# Patient Record
Sex: Male | Born: 1965 | Race: Black or African American | Hispanic: No | Marital: Single | Smoking: Never smoker
Health system: Southern US, Community
[De-identification: ages and names within clinical notes are randomized; demographics above are authoritative.]

## PROBLEM LIST (undated history)

## (undated) DIAGNOSIS — I10 Essential (primary) hypertension: Secondary | ICD-10-CM

## (undated) DIAGNOSIS — M109 Gout, unspecified: Secondary | ICD-10-CM

## (undated) DIAGNOSIS — M1990 Unspecified osteoarthritis, unspecified site: Secondary | ICD-10-CM

## (undated) DIAGNOSIS — G4730 Sleep apnea, unspecified: Secondary | ICD-10-CM

## (undated) DIAGNOSIS — T7840XA Allergy, unspecified, initial encounter: Secondary | ICD-10-CM

## (undated) HISTORY — DX: Essential (primary) hypertension: I10

## (undated) HISTORY — DX: Sleep apnea, unspecified: G47.30

## (undated) HISTORY — DX: Unspecified osteoarthritis, unspecified site: M19.90

## (undated) HISTORY — DX: Gout, unspecified: M10.9

## (undated) HISTORY — DX: Allergy, unspecified, initial encounter: T78.40XA

## (undated) HISTORY — PX: CIRCUMCISION: SUR203

---

## 1998-02-23 ENCOUNTER — Encounter: Admission: RE | Admit: 1998-02-23 | Discharge: 1998-02-23 | Payer: Self-pay | Admitting: *Deleted

## 2005-10-25 ENCOUNTER — Emergency Department (HOSPITAL_COMMUNITY): Admission: EM | Admit: 2005-10-25 | Discharge: 2005-10-25 | Payer: Self-pay | Admitting: Emergency Medicine

## 2005-12-02 ENCOUNTER — Emergency Department (HOSPITAL_COMMUNITY): Admission: EM | Admit: 2005-12-02 | Discharge: 2005-12-02 | Payer: Self-pay | Admitting: Emergency Medicine

## 2008-02-24 ENCOUNTER — Emergency Department (HOSPITAL_COMMUNITY): Admission: EM | Admit: 2008-02-24 | Discharge: 2008-02-24 | Payer: Self-pay | Admitting: Emergency Medicine

## 2008-03-25 ENCOUNTER — Emergency Department (HOSPITAL_COMMUNITY): Admission: EM | Admit: 2008-03-25 | Discharge: 2008-03-25 | Payer: Self-pay | Admitting: Emergency Medicine

## 2008-11-23 ENCOUNTER — Emergency Department (HOSPITAL_COMMUNITY): Admission: EM | Admit: 2008-11-23 | Discharge: 2008-11-23 | Payer: Self-pay | Admitting: Family Medicine

## 2009-08-03 ENCOUNTER — Emergency Department (HOSPITAL_BASED_OUTPATIENT_CLINIC_OR_DEPARTMENT_OTHER): Admission: EM | Admit: 2009-08-03 | Discharge: 2009-08-03 | Payer: Self-pay | Admitting: Emergency Medicine

## 2010-05-09 ENCOUNTER — Emergency Department (HOSPITAL_COMMUNITY): Admission: EM | Admit: 2010-05-09 | Discharge: 2010-05-09 | Payer: Self-pay | Admitting: Family Medicine

## 2010-12-23 ENCOUNTER — Emergency Department (HOSPITAL_COMMUNITY)
Admission: EM | Admit: 2010-12-23 | Discharge: 2010-12-24 | Disposition: A | Discharge status: Home / Self Care | Payer: BC Managed Care – PPO | Attending: Emergency Medicine | Admitting: Emergency Medicine

## 2010-12-23 DIAGNOSIS — M79609 Pain in unspecified limb: Secondary | ICD-10-CM | POA: Insufficient documentation

## 2010-12-23 DIAGNOSIS — I10 Essential (primary) hypertension: Secondary | ICD-10-CM | POA: Insufficient documentation

## 2010-12-23 DIAGNOSIS — M109 Gout, unspecified: Secondary | ICD-10-CM | POA: Insufficient documentation

## 2011-04-26 ENCOUNTER — Inpatient Hospital Stay (INDEPENDENT_AMBULATORY_CARE_PROVIDER_SITE_OTHER)
Admission: RE | Admit: 2011-04-26 | Discharge: 2011-04-26 | Disposition: A | Discharge status: Home / Self Care | Payer: BC Managed Care – PPO | Source: Ambulatory Visit | Attending: Family Medicine | Admitting: Family Medicine

## 2011-04-26 DIAGNOSIS — N342 Other urethritis: Secondary | ICD-10-CM

## 2011-04-26 DIAGNOSIS — A64 Unspecified sexually transmitted disease: Secondary | ICD-10-CM

## 2011-04-26 DIAGNOSIS — R319 Hematuria, unspecified: Secondary | ICD-10-CM

## 2011-04-26 LAB — POCT URINALYSIS DIP (DEVICE)
Glucose, UA: NEGATIVE mg/dL
Nitrite: NEGATIVE
Protein, ur: 100 mg/dL — AB
Urobilinogen, UA: 0.2 mg/dL (ref 0.0–1.0)

## 2011-04-27 LAB — URINE CULTURE
Colony Count: NO GROWTH
Culture  Setup Time: 201208042115
Culture: NO GROWTH

## 2014-01-04 ENCOUNTER — Encounter: Payer: Self-pay | Admitting: Gastroenterology

## 2014-02-10 ENCOUNTER — Ambulatory Visit (AMBULATORY_SURGERY_CENTER): Payer: Self-pay | Admitting: *Deleted

## 2014-02-10 VITALS — Ht 69.5 in | Wt 265.0 lb

## 2014-02-10 DIAGNOSIS — Z1211 Encounter for screening for malignant neoplasm of colon: Secondary | ICD-10-CM

## 2014-02-10 MED ORDER — MOVIPREP 100 G PO SOLR: ORAL | Status: DC

## 2014-02-10 NOTE — Progress Notes (Signed)
No egg or soy allergy  No home oxygen use or problems with anesthesia  No medications for weight loss taken  emmi information given  Pt states he has been told he as sleep apnea, but hasn't started using a CPAP yet

## 2014-02-24 ENCOUNTER — Ambulatory Visit (AMBULATORY_SURGERY_CENTER): Payer: BC Managed Care – PPO | Admitting: Gastroenterology

## 2014-02-24 ENCOUNTER — Encounter: Payer: Self-pay | Admitting: Gastroenterology

## 2014-02-24 VITALS — BP 144/79 | HR 67 | Temp 96.8°F | Resp 18 | Ht 69.5 in | Wt 265.0 lb

## 2014-02-24 DIAGNOSIS — D128 Benign neoplasm of rectum: Secondary | ICD-10-CM

## 2014-02-24 DIAGNOSIS — D129 Benign neoplasm of anus and anal canal: Secondary | ICD-10-CM

## 2014-02-24 DIAGNOSIS — Z1211 Encounter for screening for malignant neoplasm of colon: Secondary | ICD-10-CM

## 2014-02-24 DIAGNOSIS — D126 Benign neoplasm of colon, unspecified: Secondary | ICD-10-CM

## 2014-02-24 MED ORDER — SODIUM CHLORIDE 0.9 % IV SOLN: 500.0000 mL | INTRAVENOUS | Status: DC

## 2014-02-24 NOTE — Progress Notes (Signed)
Called to room to assist during endoscopic procedure.  Patient ID and intended procedure confirmed with present staff. Received instructions for my participation in the procedure from the performing physician.  

## 2014-02-24 NOTE — Patient Instructions (Signed)
YOU HAD AN ENDOSCOPIC PROCEDURE TODAY AT THE [facility]: Refer to the procedure report that was given to you for any specific questions about what was found during the examination.  If the procedure report does not answer your questions, please call your gastroenterologist to clarify.  If you requested that your care partner not be given the details of your procedure findings, then the procedure report has been included in a sealed envelope for you to review at your convenience later.  YOU SHOULD EXPECT: Some feelings of bloating in the abdomen. Passage of more gas than usual.  Walking can help get rid of the air that was put into your GI tract during the procedure and reduce the bloating. If you had a lower endoscopy (such as a colonoscopy or flexible sigmoidoscopy) you may notice spotting of blood in your stool or on the toilet paper. If you underwent a bowel prep for your procedure, then you may not have a normal bowel movement for a few days.  DIET: Your first meal following the procedure should be a light meal and then it is ok to progress to your normal diet.  A half-sandwich or bowl of soup is an example of a good first meal.  Heavy or fried foods are harder to digest and may make you feel nauseous or bloated.  Likewise meals heavy in dairy and vegetables can cause extra gas to form and this can also increase the bloating.  Drink plenty of fluids but you should avoid alcoholic beverages for 24 hours.  ACTIVITY: Your care partner should take you home directly after the procedure.  You should plan to take it easy, moving slowly for the rest of the day.  You can resume normal activity the day after the procedure however you should NOT DRIVE or use heavy machinery for 24 hours (because of the sedation medicines used during the test).    SYMPTOMS TO REPORT IMMEDIATELY: A gastroenterologist can be reached at any hour.  During normal business hours, 8:30 AM to 5:00 PM Monday through Friday,  call [phone].  After hours and on weekends, please call the GI answering service at [phone] who will take a message and have the physician on call contact you.   Following lower endoscopy (colonoscopy or flexible sigmoidoscopy):  Excessive amounts of blood in the stool  Significant tenderness or worsening of abdominal pains  Swelling of the abdomen that is new, acute  Fever of 100F or higher   FOLLOW UP: If any biopsies were taken you will be contacted by phone or by letter within the next 1-3 weeks.  Call your gastroenterologist if you have not heard about the biopsies in 3 weeks.  Our staff will call the home number listed on your records the next business day following your procedure to check on you and address any questions or concerns that you may have at that time regarding the information given to you following your procedure. This is a courtesy call and so if there is no answer at the home number and we have not heard from you through the emergency physician on call, we will assume that you have returned to your regular daily activities without incident.  SIGNATURES/CONFIDENTIALITY: You and/or your care partner have signed paperwork which will be entered into your electronic medical record.  These signatures attest to the fact that that the information above on your After Visit Summary has been reviewed and is understood.  Full responsibility of the confidentiality of   this discharge information lies with you and/or your care-partner.  Polyp-handout given  Repeat colonoscopy will be determined by pathology   

## 2014-02-24 NOTE — Progress Notes (Signed)
A/ox3, pleased with MAC, report to RN 

## 2014-02-24 NOTE — Op Note (Signed)
[facility]  [address] [place], [address]   COLONOSCOPY PROCEDURE REPORT  PATIENT: Leonard King, Leonard King  MR#: [id number] BIRTHDATE: Nov 29, 1965 , 48  yrs. old GENDER: Male ENDOSCOPIST: Milus Banister, MD REFERRED YY:[id number] Minna Antis, M.D. PROCEDURE DATE:  02/24/2014 PROCEDURE:   Colonoscopy with snare polypectomy First Screening Colonoscopy - Avg.  risk and is 50 yrs.  old or older Yes.  Prior Negative Screening - Now for repeat screening. N/A  History of Adenoma - Now for follow-up colonoscopy & has been > or = to 3 yrs.  N/A  Polyps Removed Today? Yes. ASA CLASS:   Class II INDICATIONS:average risk screening. MEDICATIONS: MAC sedation, administered by CRNA and Propofol (Diprivan) 290 mg IV  DESCRIPTION OF PROCEDURE:   After the risks benefits and alternatives of the procedure were thoroughly explained, informed consent was obtained.  A digital rectal exam revealed no abnormalities of the rectum.   The [id number]  endoscope was introduced through the anus and advanced to the cecum, which was identified by both the appendix and ileocecal valve. No adverse events experienced.   The quality of the prep was excellent.  The instrument was then slowly withdrawn as the colon was fully examined.   COLON FINDINGS: One polyp was found, removed and sent to pathology. This was sessile, 90mm across, located in trasnverse segment, removed with cold snare.  The examination was otherwise normal. Retroflexed views revealed no abnormalities. The time to cecum=1 minutes 56 seconds.  Withdrawal time=8 minutes 17 seconds.  The scope was withdrawn and the procedure completed. COMPLICATIONS: There were no complications.  ENDOSCOPIC IMPRESSION: One polyp was found, removed and sent to pathology. The examination was otherwise normal.  RECOMMENDATIONS: If the polyp(s) removed today are proven to be adenomatous (pre-cancerous) polyps, you will need a repeat colonoscopy in  5 years.  Otherwise you should continue to follow colorectal cancer screening guidelines for "routine risk" patients with colonoscopy in 10 years.  You will receive a letter within 1-2 weeks with the results of your biopsy as well as final recommendations.  Please call my office if you have not received a letter after 3 weeks.   eSigned:  Milus Banister, MD 02/24/2014 8:44 AM

## 2014-02-27 ENCOUNTER — Telehealth: Payer: Self-pay

## 2014-02-27 NOTE — Telephone Encounter (Signed)
Left a message at [phone] for the pt to call back if any questions or concerns. Maw

## 2014-03-03 ENCOUNTER — Encounter: Payer: Self-pay | Admitting: Gastroenterology

## 2015-10-16 ENCOUNTER — Ambulatory Visit (INDEPENDENT_AMBULATORY_CARE_PROVIDER_SITE_OTHER): Payer: BLUE CROSS/BLUE SHIELD | Admitting: Family Medicine

## 2015-10-16 VITALS — BP 160/114 | HR 63 | Temp 98.5°F | Resp 20 | Ht 69.5 in | Wt 244.0 lb

## 2015-10-16 DIAGNOSIS — I10 Essential (primary) hypertension: Secondary | ICD-10-CM | POA: Diagnosis not present

## 2015-10-16 DIAGNOSIS — N509 Disorder of male genital organs, unspecified: Secondary | ICD-10-CM | POA: Diagnosis not present

## 2015-10-16 DIAGNOSIS — Z711 Person with feared health complaint in whom no diagnosis is made: Secondary | ICD-10-CM

## 2015-10-16 LAB — POCT URINALYSIS DIP (MANUAL ENTRY)
Bilirubin, UA: NEGATIVE
GLUCOSE UA: NEGATIVE
Ketones, POC UA: NEGATIVE
LEUKOCYTES UA: NEGATIVE
NITRITE UA: NEGATIVE
Protein Ur, POC: NEGATIVE
RBC UA: NEGATIVE
Spec Grav, UA: 1.01
UROBILINOGEN UA: 0.2
pH, UA: 6

## 2015-10-16 LAB — POC MICROSCOPIC URINALYSIS (UMFC): Mucus: ABSENT

## 2015-10-16 LAB — HIV ANTIBODY (ROUTINE TESTING W REFLEX): HIV: NONREACTIVE

## 2015-10-16 NOTE — Progress Notes (Signed)
Patient ID: Leonard King, male    DOB: Feb 13, 1966  Age: 50 y.o. MRN: [id number]  Chief Complaint  Patient presents with  . STD screening    don't know if was exposed, but feel funny at his private parts    Subjective:   50 year old man who has had the same sexual partner for the last 4-5 months. They have frequent sex. He has been having pain or abnormal sensation in the base of the shaft of penis, feeling like he is going to have something come out but he has not had drainage or discharge. He had gonorrhea many years ago and knows what that felt like. This just is bothering him and concerning him. 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 He has not had any trauma to his genitalia. He is otherwise healthy man.  He has a history of high blood pressure and is on losartan which he did not yet take today.  Current allergies, medications, problem list, past/family and social histories reviewed.  Objective:  BP 160/114 mmHg  Pulse 63  Temp(Src) 98.5 F (36.9 C) (Oral)  Resp 20  Ht 5' 9.5" (1.765 m)  Wt 244 lb (110.678 kg)  BMI 35.53 kg/m2  SpO2 97%  No major acute distress. Normal external genitalia. No lesions noted. No discharge noted. No hernias noted. Normal testes.  Assessment & Plan:   Assessment: 1. Concern about STD in male without diagnosis   2. Genital disorder, male   3. Essential hypertension       Plan: Genital discomfort, risk of STDs, will check Blood pressure is not good. He must take his medications and check it several times in the next few days and follow-up if it continues to stay high.  Orders Placed This Encounter  Procedures  . GC/Chlamydia Probe Amp  . HIV antibody  . RPR  . POCT urinalysis dipstick  . POCT Microscopic Urinalysis ([facility])    No orders of the defined types were placed in this encounter.         Patient Instructions  We will let you know the results of your STD testing in a few days  If the STD tests come back  normal, just give your symptoms a few weeks or month or so. If he keeps having problems get rechecked.  Take his blood pressure medicines faithfully. If you're pressure does not look much better in the next few days you need to speak to your primary care doctor about this.  Always use protection     No Follow-up on file.   HOPPER,DAVID, MD 10/16/2015

## 2015-10-16 NOTE — Patient Instructions (Addendum)
We will let you know the results of your STD testing in a few days  If the STD tests come back normal, just give your symptoms a few weeks or month or so. If he keeps having problems get rechecked.  Take his blood pressure medicines faithfully. If you're pressure does not look much better in the next few days you need to speak to your primary care doctor about this.  Always use protection

## 2015-10-17 LAB — GC/CHLAMYDIA PROBE AMP
CT PROBE, AMP APTIMA: NOT DETECTED
GC PROBE AMP APTIMA: NOT DETECTED

## 2015-10-17 LAB — RPR

## 2016-12-11 ENCOUNTER — Encounter: Payer: Self-pay | Admitting: Plastic Surgery

## 2017-05-19 ENCOUNTER — Other Ambulatory Visit: Payer: Self-pay | Admitting: Internal Medicine

## 2017-05-20 ENCOUNTER — Other Ambulatory Visit: Payer: Self-pay | Admitting: Internal Medicine

## 2017-05-20 DIAGNOSIS — R200 Anesthesia of skin: Secondary | ICD-10-CM

## 2017-06-03 ENCOUNTER — Inpatient Hospital Stay
Admission: RE | Admit: 2017-06-03 | Discharge: 2017-06-03 | Disposition: A | Discharge status: Home / Self Care | Payer: Self-pay | Source: Ambulatory Visit | Attending: Internal Medicine | Admitting: Internal Medicine

## 2017-11-03 DIAGNOSIS — I10 Essential (primary) hypertension: Secondary | ICD-10-CM | POA: Diagnosis not present

## 2017-11-03 DIAGNOSIS — M109 Gout, unspecified: Secondary | ICD-10-CM | POA: Diagnosis not present

## 2017-11-03 DIAGNOSIS — E781 Pure hyperglyceridemia: Secondary | ICD-10-CM | POA: Diagnosis not present

## 2017-11-09 DIAGNOSIS — I10 Essential (primary) hypertension: Secondary | ICD-10-CM | POA: Diagnosis not present

## 2017-11-09 DIAGNOSIS — M109 Gout, unspecified: Secondary | ICD-10-CM | POA: Diagnosis not present

## 2017-11-09 DIAGNOSIS — D179 Benign lipomatous neoplasm, unspecified: Secondary | ICD-10-CM | POA: Diagnosis not present

## 2017-11-11 DIAGNOSIS — K7689 Other specified diseases of liver: Secondary | ICD-10-CM | POA: Diagnosis not present

## 2017-11-11 DIAGNOSIS — M109 Gout, unspecified: Secondary | ICD-10-CM | POA: Diagnosis not present

## 2017-11-11 DIAGNOSIS — E559 Vitamin D deficiency, unspecified: Secondary | ICD-10-CM | POA: Diagnosis not present

## 2017-11-12 DIAGNOSIS — R740 Nonspecific elevation of levels of transaminase and lactic acid dehydrogenase [LDH]: Secondary | ICD-10-CM | POA: Diagnosis not present

## 2017-11-12 DIAGNOSIS — R945 Abnormal results of liver function studies: Secondary | ICD-10-CM | POA: Diagnosis not present

## 2018-02-24 DIAGNOSIS — K7689 Other specified diseases of liver: Secondary | ICD-10-CM | POA: Diagnosis not present

## 2018-02-24 DIAGNOSIS — M109 Gout, unspecified: Secondary | ICD-10-CM | POA: Diagnosis not present

## 2018-02-24 DIAGNOSIS — E7800 Pure hypercholesterolemia, unspecified: Secondary | ICD-10-CM | POA: Diagnosis not present

## 2018-03-08 DIAGNOSIS — I10 Essential (primary) hypertension: Secondary | ICD-10-CM | POA: Diagnosis not present

## 2018-03-08 DIAGNOSIS — D179 Benign lipomatous neoplasm, unspecified: Secondary | ICD-10-CM | POA: Diagnosis not present

## 2018-03-08 DIAGNOSIS — M5432 Sciatica, left side: Secondary | ICD-10-CM | POA: Diagnosis not present

## 2018-03-08 DIAGNOSIS — M109 Gout, unspecified: Secondary | ICD-10-CM | POA: Diagnosis not present

## 2018-05-14 DIAGNOSIS — M109 Gout, unspecified: Secondary | ICD-10-CM | POA: Diagnosis not present

## 2018-05-14 DIAGNOSIS — N529 Male erectile dysfunction, unspecified: Secondary | ICD-10-CM | POA: Diagnosis not present

## 2018-05-14 DIAGNOSIS — Z125 Encounter for screening for malignant neoplasm of prostate: Secondary | ICD-10-CM | POA: Diagnosis not present

## 2018-05-14 DIAGNOSIS — R5383 Other fatigue: Secondary | ICD-10-CM | POA: Diagnosis not present

## 2018-06-23 ENCOUNTER — Encounter (INDEPENDENT_AMBULATORY_CARE_PROVIDER_SITE_OTHER): Payer: Self-pay | Admitting: Orthopaedic Surgery

## 2018-06-23 ENCOUNTER — Ambulatory Visit (INDEPENDENT_AMBULATORY_CARE_PROVIDER_SITE_OTHER): Payer: Self-pay

## 2018-06-23 ENCOUNTER — Ambulatory Visit (INDEPENDENT_AMBULATORY_CARE_PROVIDER_SITE_OTHER): Payer: Commercial Managed Care - PPO | Admitting: Orthopaedic Surgery

## 2018-06-23 VITALS — BP 154/101 | HR 62 | Ht 70.0 in | Wt 240.0 lb

## 2018-06-23 DIAGNOSIS — M79605 Pain in left leg: Secondary | ICD-10-CM

## 2018-06-23 NOTE — Progress Notes (Signed)
Office Visit Note   Patient: Leonard King           Date of Birth: [birth year]           MRN: [id number] Visit Date: 06/23/2018              Requested by: Jani Gravel, [address] [place], [place] [address] PCP: Patient, No Pcp Per   Assessment & Plan: Visit Diagnoses:  1. Pain in left leg     Plan: We discussed the patient some of his symptoms may be related to gout.  He likely has some disc bulge with lateral recess narrowing and some numbness in his leg without weakness.  He needs to revisit with his PCP to see if he needs to be on allopurinol again particularly if he has some hyperuricemia which is aggravating his hips as well as lumbar facet joints possibly.  He can take his anti-inflammatories for several days before he restarts his allopurinol.  If he develops increased weakness he can return.  At present I would not recommend proceeding with an MRI scan lumbar since his symptoms are not significant enough to proceed with further treatment.  Follow-Up Instructions: No follow-ups on file.   Orders:  Orders Placed This Encounter  Procedures  . XR Pelvis 1-2 Views  . XR Lumbar Spine 2-3 Views   No orders of the defined types were placed in this encounter.     Procedures: No procedures performed   Clinical Data: No additional findings.   Subjective: Chief Complaint  Patient presents with  . Left Leg - Pain, Numbness    HPI 52 year old male with complaints of some numbness and tingling in the left lateral thigh this been going on for more than a year.  He denies associated back pain does not have any weakness.  He does a lot of walking he is noticed some increased numbness in the anterior thigh sometimes it bothers him at night if he is been active.  He is a former smoker.  Does have a history of gout but has stopped his allopurinol.  He does have indomethacin available and also colchicine which he uses occasionally.  He is not sure what his last  uric acid level was.  Review of Systems 14 point review of systems positive for left leg numbness.  Hypertension, history of gout otherwise negative is a pertains HPI.   Objective: Vital Signs: BP (!) 154/101   Pulse 62   Ht 5\' 10"  (1.778 m)   Wt 240 lb (108.9 kg)   BMI 34.44 kg/m   Physical Exam  Constitutional: He is oriented to person, place, and time. He appears well-developed and well-nourished.  HENT:  Head: Normocephalic and atraumatic.  Eyes: Pupils are equal, round, and reactive to light. EOM are normal.  Neck: No tracheal deviation present. No thyromegaly present.  Cardiovascular: Normal rate.  Pulmonary/Chest: Effort normal. He has no wheezes.  Abdominal: Soft. Bowel sounds are normal.  Neurological: He is alert and oriented to person, place, and time.  Skin: Skin is warm and dry. Capillary refill takes less than 2 seconds.  Psychiatric: He has a normal mood and affect. His behavior is normal. Judgment and thought content normal.    Ortho Exam negative straight leg raising negative for straight leg raising.  Normal hip flexion no adduction weakness.  Knee and ankle jerk 1+ and symmetrical he is able heel and toe walk.  He has difficulty doing figure 4 leg crossed  in the sitting position both right and left with more limitation of internal and external rotation of his hip on the left and right.  Specialty Comments:  No specialty comments available.  Imaging: No results found.   PMFS History: There are no active problems to display for this patient.  Past Medical History:  Diagnosis Date  . Allergy   . Arthritis   . Gout   . Hypertension   . Sleep apnea    not using CPAP yet    Family History  Problem Relation Age of Onset  . Hypertension Mother   . Hypertension Father   . Colon cancer Neg Hx   . Esophageal cancer Neg Hx   . Rectal cancer Neg Hx   . Stomach cancer Neg Hx     Past Surgical History:  Procedure Laterality Date  . CIRCUMCISION     in  high school, 2nd one   Social History   Occupational History  . Not on file  Tobacco Use  . Smoking status: Never Smoker  . Smokeless tobacco: Never Used  . Tobacco comment: marijuana- over 20 years ago  Substance and Sexual Activity  . Alcohol use: Yes    Comment: occasional  . Drug use: No  . Sexual activity: Not on file

## 2018-08-06 DIAGNOSIS — R972 Elevated prostate specific antigen [PSA]: Secondary | ICD-10-CM | POA: Diagnosis not present

## 2018-08-06 DIAGNOSIS — E291 Testicular hypofunction: Secondary | ICD-10-CM | POA: Diagnosis not present

## 2019-02-26 ENCOUNTER — Other Ambulatory Visit: Payer: Self-pay | Admitting: *Deleted

## 2019-02-26 DIAGNOSIS — Z20822 Contact with and (suspected) exposure to covid-19: Secondary | ICD-10-CM

## 2019-02-28 LAB — NOVEL CORONAVIRUS, NAA: SARS-CoV-2, NAA: NOT DETECTED

## 2019-03-02 ENCOUNTER — Telehealth: Payer: Self-pay

## 2019-03-02 NOTE — Telephone Encounter (Signed)
Patient returned call for covid test results, results given as noted not detected, which means you were not infected with the novel coronavirus. He verbalized understanding and says he feels great, no symptoms. He asked for a copy to be mailed to his home so that he can have it for his employer, correct address verified in chart. I advised it will be mailed out.

## 2019-03-02 NOTE — Telephone Encounter (Signed)
Patient requesting a copy be faxed to number below.   [phone] attn Candice

## 2019-03-02 NOTE — Telephone Encounter (Addendum)
Fax # [phone] did not work with 2 attempts to send COVID result to employer.  Called pt., and left vm to provide a different fax #.    Per pt. request, mailed copy of COVID 19 result to his residence, and faxed copy to Employer, to Attention of Candice @ [phone].

## 2019-11-18 ENCOUNTER — Other Ambulatory Visit: Payer: Self-pay | Admitting: Family Medicine

## 2019-11-18 DIAGNOSIS — M5416 Radiculopathy, lumbar region: Secondary | ICD-10-CM

## 2019-11-18 DIAGNOSIS — G5712 Meralgia paresthetica, left lower limb: Secondary | ICD-10-CM

## 2019-12-21 ENCOUNTER — Other Ambulatory Visit: Payer: Self-pay

## 2020-02-13 ENCOUNTER — Ambulatory Visit
Admission: RE | Admit: 2020-02-13 | Discharge: 2020-02-13 | Disposition: A | Discharge status: Home / Self Care | Payer: BC Managed Care – PPO | Source: Ambulatory Visit | Attending: Family Medicine | Admitting: Family Medicine

## 2020-02-13 ENCOUNTER — Other Ambulatory Visit: Payer: Self-pay

## 2020-02-13 DIAGNOSIS — M5416 Radiculopathy, lumbar region: Secondary | ICD-10-CM

## 2020-02-13 DIAGNOSIS — G5712 Meralgia paresthetica, left lower limb: Secondary | ICD-10-CM

## 2020-02-13 IMAGING — MR MR LUMBAR SPINE W/O CM
4 of 5 series · 25 of 48 positions shown · non-contrast
Comparison: Radiographs [DATE].

CLINICAL DATA: Left leg numbness from the mid thigh down for 3-4
years. No bowel or bladder changes, acute injury or prior relevant
surgery.

EXAM:
MRI LUMBAR SPINE WITHOUT CONTRAST
TECHNIQUE: Multiplanar, multisequence MR imaging of the lumbar spine was
performed. No intravenous contrast was administered.

[Series 3: T2 · sagittal · 4.0mm · 0.55mm/px · 6 of 17 slices shown (1 of 2)]
[im 1/17]
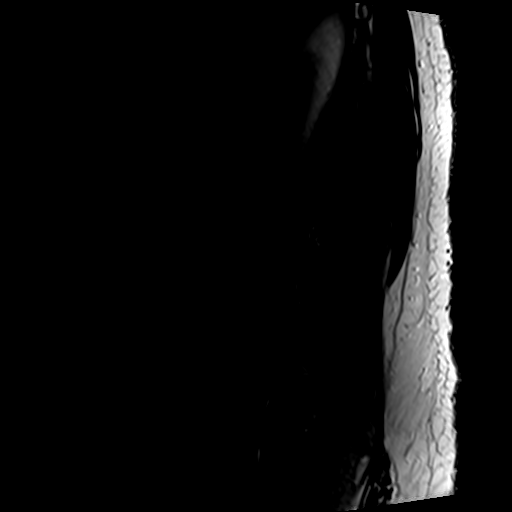
[im 4/17]
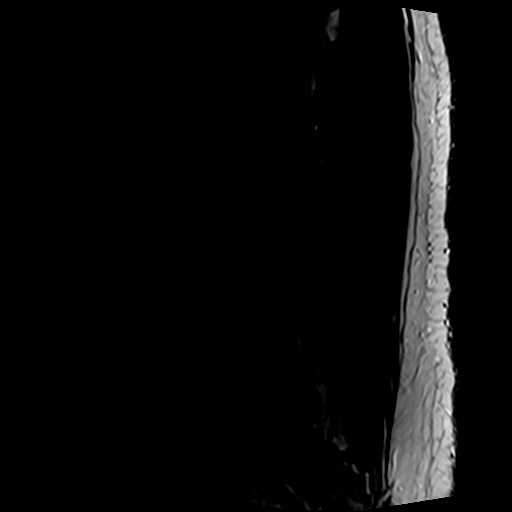
[im 7/17]
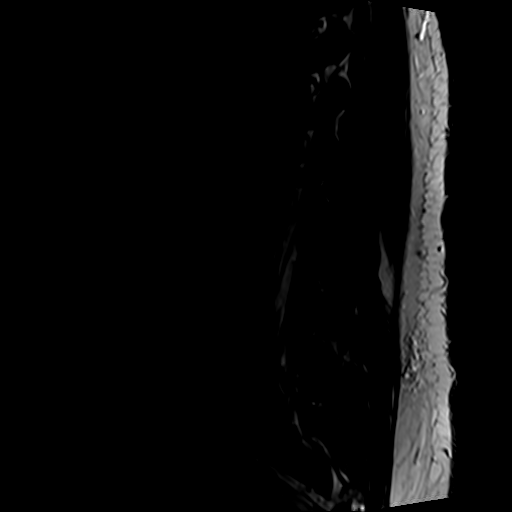
[im 10/17]
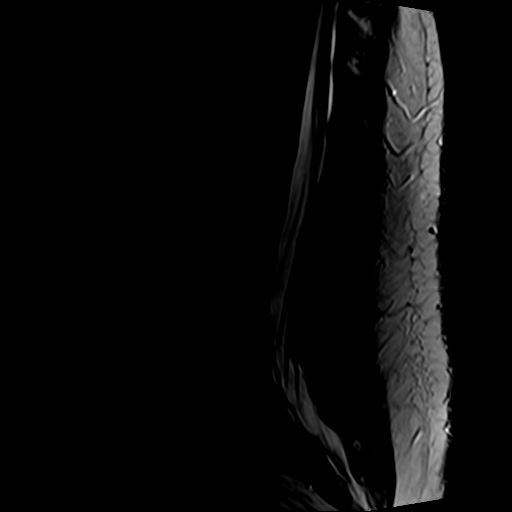
[im 13/17]
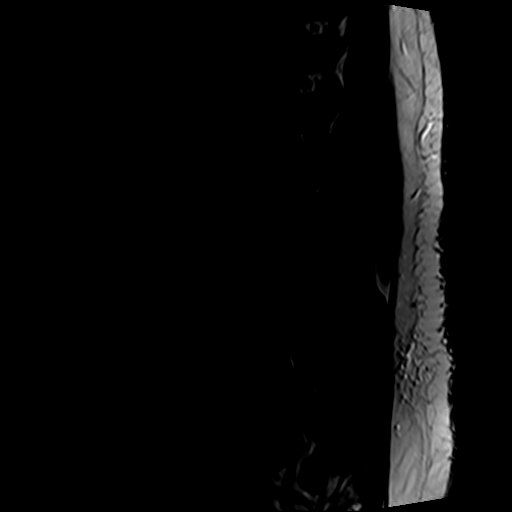
[im 17/17]
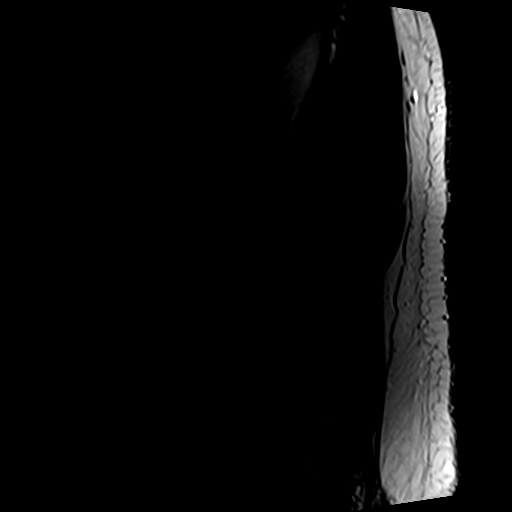

[Series 4: T1 · sagittal · 4.0mm · 0.55mm/px · 6 of 17 slices shown (1 of 2)]
[im 1/17]
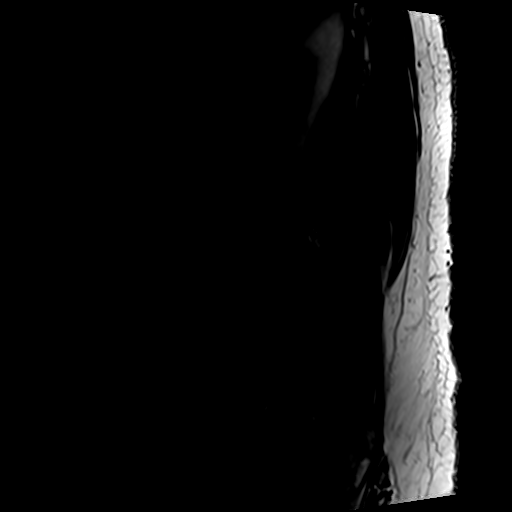
[im 4/17]
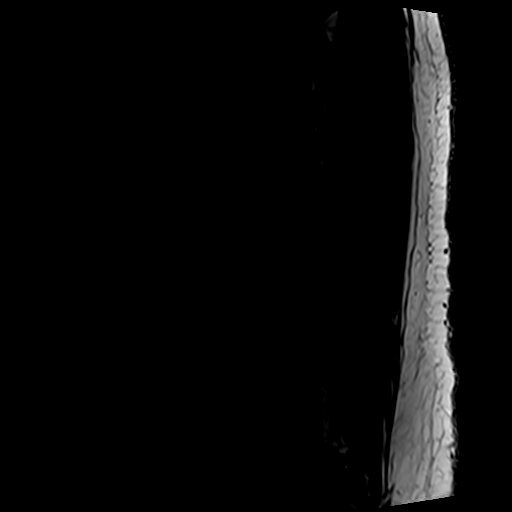
[im 7/17]
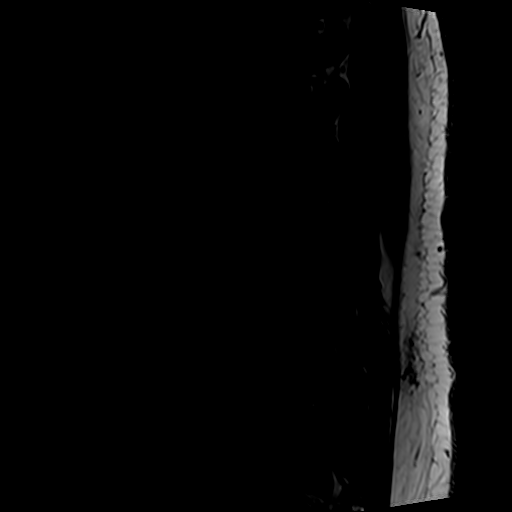
[im 10/17]
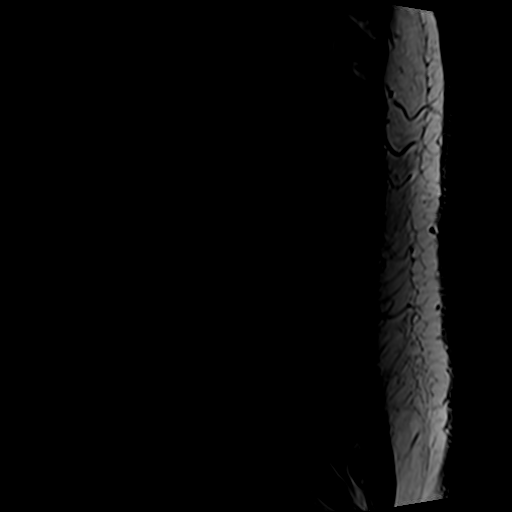
[im 13/17]
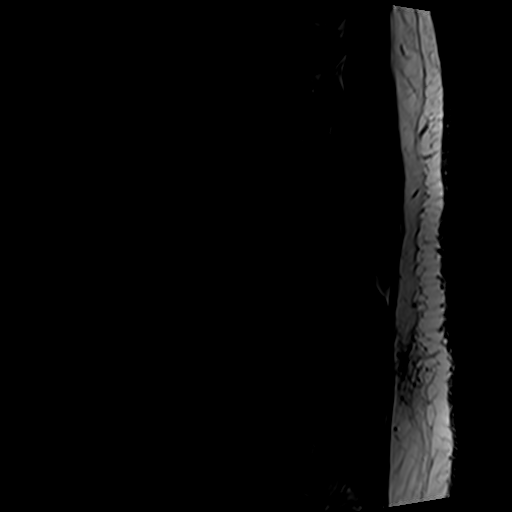
[im 17/17]
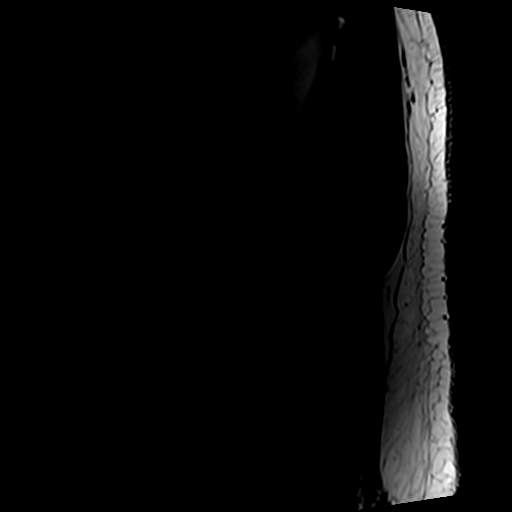

[Series 7: T1 · axial · 4.0mm · 0.35mm/px · z∈[-71,+128]mm · 4 of 43 slices shown (2 of 2)]
[im 1/43]
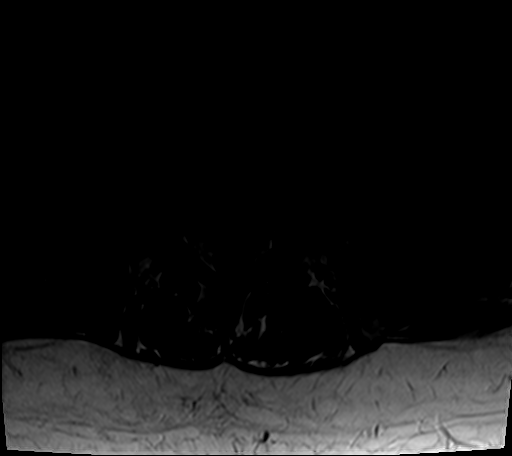
[im 7/43]
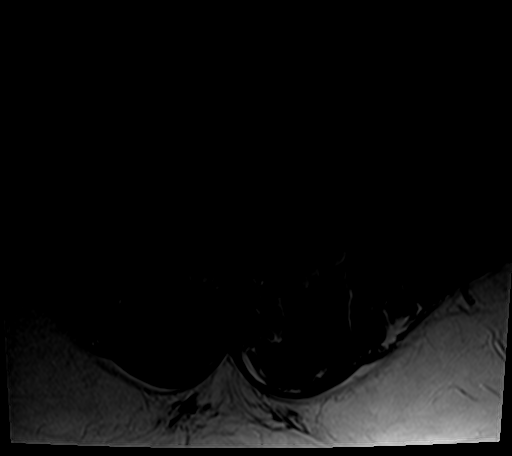
[im 22/43]
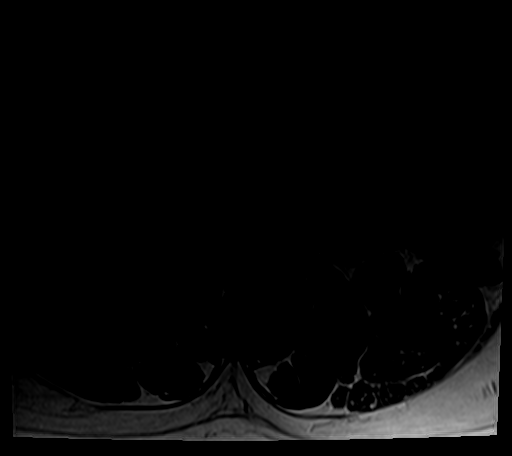
[im 37/43]
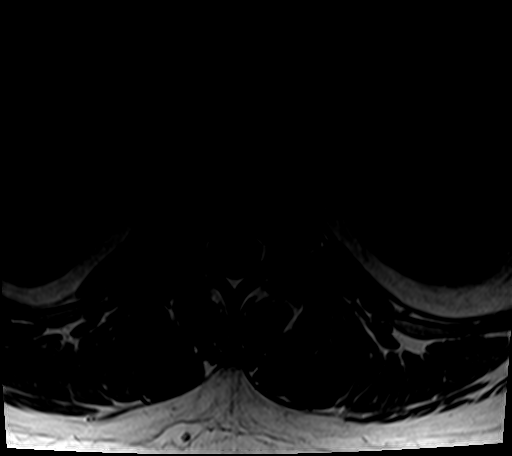

[Series 8: T2 · axial · 4.0mm · 0.70mm/px · z∈[-71,+158]mm · 9 of 42 slices shown (2 of 2)]
[im 1/42]
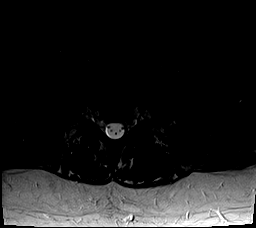
[im 6/42]
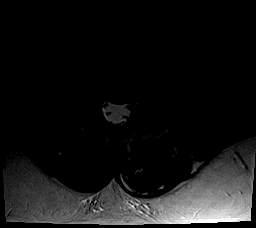
[im 12/42]
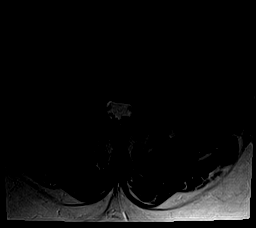
[im 18/42]
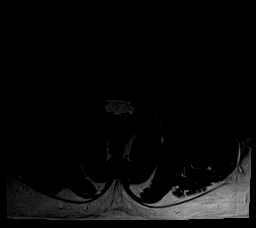
[im 21/42]
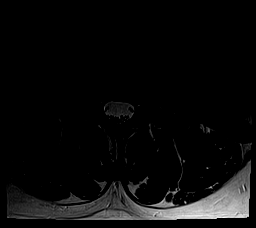
[im 24/42]
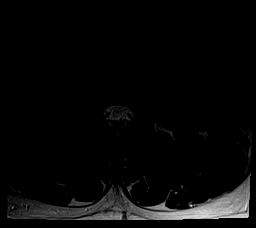
[im 30/42]
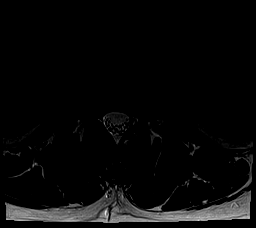
[im 36/42]
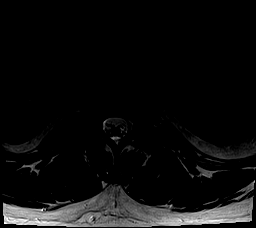
[im 42/42]
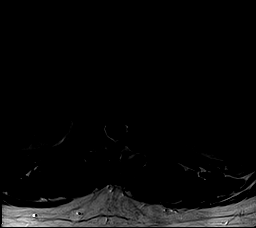

[25 of 48 positions shown; findings below may reference images not displayed]

FINDINGS: Segmentation: Conventional anatomy assumed, with the last open disc
space designated L5-S1.

Alignment: 2 mm of degenerative anterolisthesis at L4-5. Otherwise
normal.

Vertebrae: No worrisome osseous lesion, acute fracture or pars
defect. Prominent facet disease in the lower lumbar spine, greatest
at L4-5.There is a large hemangioma within the left aspect of the L3
vertebral body. The visualized sacroiliac joints appear normal.

Conus medullaris: Extends to the L1 level and appears normal.

Paraspinal and other soft tissues: No significant paraspinal
findings.

Disc levels:

From T11-12 through L2-3, the disc height and hydration are
maintained. There is no disc herniation, spinal stenosis or nerve
root encroachment.

L3-4: Disc height and hydration are maintained. Minimal disc
bulging. Mild facet hypertrophy. No spinal stenosis or nerve root
encroachment.

L4-5: Mild disc desiccation and bulging. Small foraminal annular
rents bilaterally. Advanced facet and ligamentous hypertrophy with
small bilateral facet joint effusions. These factors contribute to
mild neural foraminal narrowing bilaterally, but no definite nerve
root encroachment.

L5-S1: Mild disc bulging eccentric to the left and mild bilateral
facet hypertrophy. Mild right and moderate left foraminal narrowing.
IMPRESSION: 1. Prominent facet disease at L4-5 with resulting grade 1
anterolisthesis and mild neural foraminal narrowing bilaterally.
2. Mild right and moderate left foraminal narrowing at L5-S1.
3. No acute findings.

## 2021-12-23 DIAGNOSIS — H16223 Keratoconjunctivitis sicca, not specified as Sjogren's, bilateral: Secondary | ICD-10-CM | POA: Diagnosis not present

## 2021-12-23 DIAGNOSIS — H02885 Meibomian gland dysfunction left lower eyelid: Secondary | ICD-10-CM | POA: Diagnosis not present

## 2022-02-13 ENCOUNTER — Other Ambulatory Visit (HOSPITAL_BASED_OUTPATIENT_CLINIC_OR_DEPARTMENT_OTHER): Payer: Self-pay | Admitting: Family Medicine

## 2022-02-13 DIAGNOSIS — I10 Essential (primary) hypertension: Secondary | ICD-10-CM | POA: Diagnosis not present

## 2022-02-13 DIAGNOSIS — R140 Abdominal distension (gaseous): Secondary | ICD-10-CM

## 2022-02-13 DIAGNOSIS — N529 Male erectile dysfunction, unspecified: Secondary | ICD-10-CM | POA: Diagnosis not present

## 2022-02-13 DIAGNOSIS — Z1329 Encounter for screening for other suspected endocrine disorder: Secondary | ICD-10-CM | POA: Diagnosis not present

## 2022-02-13 DIAGNOSIS — M1A9XX0 Chronic gout, unspecified, without tophus (tophi): Secondary | ICD-10-CM | POA: Diagnosis not present

## 2022-02-13 DIAGNOSIS — Z0000 Encounter for general adult medical examination without abnormal findings: Secondary | ICD-10-CM | POA: Diagnosis not present

## 2022-02-13 DIAGNOSIS — R7309 Other abnormal glucose: Secondary | ICD-10-CM | POA: Diagnosis not present

## 2022-02-13 DIAGNOSIS — Z125 Encounter for screening for malignant neoplasm of prostate: Secondary | ICD-10-CM | POA: Diagnosis not present

## 2022-02-13 DIAGNOSIS — E7800 Pure hypercholesterolemia, unspecified: Secondary | ICD-10-CM | POA: Diagnosis not present

## 2022-02-14 ENCOUNTER — Other Ambulatory Visit (HOSPITAL_COMMUNITY): Payer: Self-pay | Admitting: Family Medicine

## 2022-02-14 ENCOUNTER — Other Ambulatory Visit: Payer: Self-pay | Admitting: Family Medicine

## 2022-02-14 DIAGNOSIS — Z23 Encounter for immunization: Secondary | ICD-10-CM | POA: Diagnosis not present

## 2022-02-14 DIAGNOSIS — Z0000 Encounter for general adult medical examination without abnormal findings: Secondary | ICD-10-CM | POA: Diagnosis not present

## 2022-02-14 DIAGNOSIS — E7800 Pure hypercholesterolemia, unspecified: Secondary | ICD-10-CM

## 2022-02-14 DIAGNOSIS — R0989 Other specified symptoms and signs involving the circulatory and respiratory systems: Secondary | ICD-10-CM

## 2022-02-24 ENCOUNTER — Other Ambulatory Visit: Payer: BC Managed Care – PPO

## 2022-02-24 ENCOUNTER — Ambulatory Visit
Admission: RE | Admit: 2022-02-24 | Discharge: 2022-02-24 | Disposition: A | Discharge status: Home / Self Care | Payer: BC Managed Care – PPO | Source: Ambulatory Visit | Attending: Family Medicine | Admitting: Family Medicine

## 2022-02-24 DIAGNOSIS — R200 Anesthesia of skin: Secondary | ICD-10-CM | POA: Diagnosis not present

## 2022-02-24 DIAGNOSIS — I998 Other disorder of circulatory system: Secondary | ICD-10-CM | POA: Diagnosis not present

## 2022-02-24 DIAGNOSIS — R0989 Other specified symptoms and signs involving the circulatory and respiratory systems: Secondary | ICD-10-CM

## 2022-02-26 ENCOUNTER — Other Ambulatory Visit (HOSPITAL_COMMUNITY): Payer: BC Managed Care – PPO

## 2022-03-04 ENCOUNTER — Ambulatory Visit (HOSPITAL_COMMUNITY): Payer: BC Managed Care – PPO

## 2022-03-04 ENCOUNTER — Encounter (HOSPITAL_COMMUNITY): Payer: Self-pay

## 2022-03-18 DIAGNOSIS — H02885 Meibomian gland dysfunction left lower eyelid: Secondary | ICD-10-CM | POA: Diagnosis not present

## 2022-03-18 DIAGNOSIS — H16223 Keratoconjunctivitis sicca, not specified as Sjogren's, bilateral: Secondary | ICD-10-CM | POA: Diagnosis not present

## 2022-03-18 DIAGNOSIS — H02882 Meibomian gland dysfunction right lower eyelid: Secondary | ICD-10-CM | POA: Diagnosis not present

## 2022-04-21 DIAGNOSIS — Z23 Encounter for immunization: Secondary | ICD-10-CM | POA: Diagnosis not present

## 2022-06-23 DIAGNOSIS — H02885 Meibomian gland dysfunction left lower eyelid: Secondary | ICD-10-CM | POA: Diagnosis not present

## 2022-06-23 DIAGNOSIS — H16223 Keratoconjunctivitis sicca, not specified as Sjogren's, bilateral: Secondary | ICD-10-CM | POA: Diagnosis not present

## 2022-06-23 DIAGNOSIS — H02882 Meibomian gland dysfunction right lower eyelid: Secondary | ICD-10-CM | POA: Diagnosis not present

## 2022-08-11 DIAGNOSIS — M109 Gout, unspecified: Secondary | ICD-10-CM | POA: Diagnosis not present

## 2022-08-11 DIAGNOSIS — I10 Essential (primary) hypertension: Secondary | ICD-10-CM | POA: Diagnosis not present

## 2022-08-11 DIAGNOSIS — Z23 Encounter for immunization: Secondary | ICD-10-CM | POA: Diagnosis not present

## 2022-08-11 DIAGNOSIS — R140 Abdominal distension (gaseous): Secondary | ICD-10-CM | POA: Diagnosis not present

## 2022-08-12 ENCOUNTER — Other Ambulatory Visit (HOSPITAL_COMMUNITY): Payer: Self-pay | Admitting: Family Medicine

## 2022-08-12 ENCOUNTER — Other Ambulatory Visit: Payer: Self-pay | Admitting: Family Medicine

## 2022-08-12 DIAGNOSIS — E7800 Pure hypercholesterolemia, unspecified: Secondary | ICD-10-CM

## 2022-08-12 DIAGNOSIS — R140 Abdominal distension (gaseous): Secondary | ICD-10-CM

## 2022-08-25 NOTE — Progress Notes (Deleted)
   I, Peterson Lombard, LAT, ATC acting as a scribe for Lynne Leader, MD.  Subjective:    CC: Low back pain  HPI: Pt is a 56 y/o male c/o low back pain x /. Pt locates pain to  Radiating pain: LE numbness/tingling: LE weakness: Aggravates: Treatments tried:  Dx imaging: 02/13/20 L-spine MRI  06/23/18 L-spine & pelvis XR  Pertinent review of Systems: ***  Relevant historical information: ***   Objective:   There were no vitals filed for this visit. General: Well Developed, well nourished, and in no acute distress.   MSK: ***  Lab and Radiology Results No results found for this or any previous visit (from the past 72 hour(s)). No results found.    Impression and Recommendations:    Assessment and Plan: 56 y.o. male with ***.  PDMP not reviewed this encounter. No orders of the defined types were placed in this encounter.  No orders of the defined types were placed in this encounter.   Discussed warning signs or symptoms. Please see discharge instructions. Patient expresses understanding.   ***

## 2022-08-26 ENCOUNTER — Ambulatory Visit: Payer: BC Managed Care – PPO | Admitting: Family Medicine

## 2022-08-29 ENCOUNTER — Ambulatory Visit
Admission: RE | Admit: 2022-08-29 | Discharge: 2022-08-29 | Disposition: A | Discharge status: Home / Self Care | Payer: BC Managed Care – PPO | Source: Ambulatory Visit | Attending: Family Medicine | Admitting: Family Medicine

## 2022-08-29 DIAGNOSIS — R140 Abdominal distension (gaseous): Secondary | ICD-10-CM | POA: Diagnosis not present

## 2022-08-29 DIAGNOSIS — K760 Fatty (change of) liver, not elsewhere classified: Secondary | ICD-10-CM | POA: Diagnosis not present

## 2022-09-25 DIAGNOSIS — N5201 Erectile dysfunction due to arterial insufficiency: Secondary | ICD-10-CM | POA: Diagnosis not present

## 2022-09-25 DIAGNOSIS — F524 Premature ejaculation: Secondary | ICD-10-CM | POA: Diagnosis not present

## 2022-09-29 ENCOUNTER — Ambulatory Visit (HOSPITAL_BASED_OUTPATIENT_CLINIC_OR_DEPARTMENT_OTHER)
Admission: RE | Admit: 2022-09-29 | Discharge: 2022-09-29 | Disposition: A | Discharge status: Home / Self Care | Payer: BC Managed Care – PPO | Source: Ambulatory Visit | Attending: Family Medicine | Admitting: Family Medicine

## 2022-09-29 DIAGNOSIS — E7800 Pure hypercholesterolemia, unspecified: Secondary | ICD-10-CM

## 2022-11-04 ENCOUNTER — Telehealth: Payer: Self-pay

## 2022-11-04 NOTE — Telephone Encounter (Signed)
Mychart msg sent. AS, CMA

## 2022-11-19 DIAGNOSIS — E7800 Pure hypercholesterolemia, unspecified: Secondary | ICD-10-CM | POA: Diagnosis not present

## 2023-05-29 DIAGNOSIS — U071 COVID-19: Secondary | ICD-10-CM | POA: Diagnosis not present

## 2023-06-04 DIAGNOSIS — Z3009 Encounter for other general counseling and advice on contraception: Secondary | ICD-10-CM | POA: Diagnosis not present

## 2023-07-16 DIAGNOSIS — Z125 Encounter for screening for malignant neoplasm of prostate: Secondary | ICD-10-CM | POA: Diagnosis not present

## 2023-07-16 DIAGNOSIS — R140 Abdominal distension (gaseous): Secondary | ICD-10-CM | POA: Diagnosis not present

## 2023-07-16 DIAGNOSIS — R7303 Prediabetes: Secondary | ICD-10-CM | POA: Diagnosis not present

## 2023-07-16 DIAGNOSIS — Z0000 Encounter for general adult medical examination without abnormal findings: Secondary | ICD-10-CM | POA: Diagnosis not present

## 2023-07-16 DIAGNOSIS — M109 Gout, unspecified: Secondary | ICD-10-CM | POA: Diagnosis not present

## 2023-07-16 DIAGNOSIS — G4733 Obstructive sleep apnea (adult) (pediatric): Secondary | ICD-10-CM | POA: Diagnosis not present

## 2023-07-16 DIAGNOSIS — Z23 Encounter for immunization: Secondary | ICD-10-CM | POA: Diagnosis not present

## 2024-05-19 ENCOUNTER — Other Ambulatory Visit: Payer: Self-pay | Admitting: Family Medicine

## 2024-05-19 DIAGNOSIS — R42 Dizziness and giddiness: Secondary | ICD-10-CM

## 2024-05-29 ENCOUNTER — Ambulatory Visit
Admission: RE | Admit: 2024-05-29 | Discharge: 2024-05-29 | Disposition: A | Discharge status: Home / Self Care | Source: Ambulatory Visit | Attending: Family Medicine | Admitting: Family Medicine

## 2024-05-29 DIAGNOSIS — R42 Dizziness and giddiness: Secondary | ICD-10-CM
